# Patient Record
Sex: Male | Born: 1995 | Hispanic: Yes | Marital: Single
Health system: Southern US, Community
[De-identification: ages and names within clinical notes are randomized; demographics above are authoritative.]

---

## 2017-06-17 ENCOUNTER — Emergency Department (HOSPITAL_COMMUNITY): Payer: Self-pay

## 2017-06-17 ENCOUNTER — Emergency Department (HOSPITAL_COMMUNITY)
Admission: EM | Admit: 2017-06-17 | Discharge: 2017-06-17 | Disposition: A | Discharge status: Home / Self Care | Payer: Self-pay | Attending: Emergency Medicine | Admitting: Emergency Medicine

## 2017-06-17 DIAGNOSIS — Y939 Activity, unspecified: Secondary | ICD-10-CM | POA: Insufficient documentation

## 2017-06-17 DIAGNOSIS — W1830XA Fall on same level, unspecified, initial encounter: Secondary | ICD-10-CM | POA: Insufficient documentation

## 2017-06-17 DIAGNOSIS — Y999 Unspecified external cause status: Secondary | ICD-10-CM | POA: Insufficient documentation

## 2017-06-17 DIAGNOSIS — S0990XA Unspecified injury of head, initial encounter: Secondary | ICD-10-CM

## 2017-06-17 DIAGNOSIS — S098XXA Other specified injuries of head, initial encounter: Secondary | ICD-10-CM | POA: Insufficient documentation

## 2017-06-17 DIAGNOSIS — Y908 Blood alcohol level of 240 mg/100 ml or more: Secondary | ICD-10-CM | POA: Insufficient documentation

## 2017-06-17 DIAGNOSIS — F10920 Alcohol use, unspecified with intoxication, uncomplicated: Secondary | ICD-10-CM

## 2017-06-17 DIAGNOSIS — F10220 Alcohol dependence with intoxication, uncomplicated: Secondary | ICD-10-CM | POA: Insufficient documentation

## 2017-06-17 DIAGNOSIS — Y929 Unspecified place or not applicable: Secondary | ICD-10-CM | POA: Insufficient documentation

## 2017-06-17 LAB — COMPREHENSIVE METABOLIC PANEL
ALT: 93 U/L — ABNORMAL HIGH (ref 17–63)
ANION GAP: 12 (ref 5–15)
AST: 74 U/L — ABNORMAL HIGH (ref 15–41)
Albumin: 4.4 g/dL (ref 3.5–5.0)
Alkaline Phosphatase: 165 U/L — ABNORMAL HIGH (ref 38–126)
BUN: 10 mg/dL (ref 6–20)
CALCIUM: 8.3 mg/dL — AB (ref 8.9–10.3)
CHLORIDE: 107 mmol/L (ref 101–111)
CO2: 19 mmol/L — AB (ref 22–32)
Creatinine, Ser: 0.99 mg/dL (ref 0.61–1.24)
GFR calc non Af Amer: >60 mL/min (ref >60)
Glucose, Bld: 158 mg/dL — ABNORMAL HIGH (ref 65–99)
Potassium: 3.1 mmol/L — ABNORMAL LOW (ref 3.5–5.1)
SODIUM: 138 mmol/L (ref 135–145)
Total Bilirubin: 1.3 mg/dL — ABNORMAL HIGH (ref 0.3–1.2)
Total Protein: 6.9 g/dL (ref 6.5–8.1)

## 2017-06-17 LAB — ACETAMINOPHEN LEVEL

## 2017-06-17 LAB — CBC WITH DIFFERENTIAL/PLATELET
BASOS PCT: 0 %
Basophils Absolute: 0 10*3/uL (ref 0.0–0.1)
EOS ABS: 0.3 10*3/uL (ref 0.0–0.7)
EOS PCT: 2 %
HCT: 46.4 % (ref 39.0–52.0)
Hemoglobin: 16.8 g/dL (ref 13.0–17.0)
LYMPHS ABS: 4.2 10*3/uL — AB (ref 0.7–4.0)
Lymphocytes Relative: 33 %
MCH: 33.2 pg (ref 26.0–34.0)
MCHC: 36.2 g/dL — AB (ref 30.0–36.0)
MCV: 91.7 fL (ref 78.0–100.0)
Monocytes Absolute: 1.3 10*3/uL — ABNORMAL HIGH (ref 0.1–1.0)
Monocytes Relative: 10 %
Neutro Abs: 6.8 10*3/uL (ref 1.7–7.7)
Neutrophils Relative %: 55 %
PLATELETS: 305 10*3/uL (ref 150–400)
RBC: 5.06 MIL/uL (ref 4.22–5.81)
RDW: 12.5 % (ref 11.5–15.5)
WBC: 12.6 10*3/uL — AB (ref 4.0–10.5)

## 2017-06-17 LAB — SALICYLATE LEVEL

## 2017-06-17 LAB — ETHANOL: Alcohol, Ethyl (B): 334 mg/dL (ref <10)

## 2017-06-17 LAB — CK: Total CK: 96 U/L (ref 49–397)

## 2017-06-17 MED ORDER — ONDANSETRON HCL 4 MG/2ML IJ SOLN
INTRAMUSCULAR | Status: AC
  Filled 2017-06-17: qty 2

## 2017-06-17 MED ORDER — ONDANSETRON HCL 4 MG/2ML IJ SOLN
4.0000 mg | Freq: Once | INTRAMUSCULAR | Status: AC
  Administered 2017-06-17: 4 mg via INTRAVENOUS

## 2017-06-17 MED ORDER — SODIUM CHLORIDE 0.9 % IV SOLN
Freq: Once | INTRAVENOUS | Status: AC
  Administered 2017-06-17: 03:00:00 via INTRAVENOUS

## 2017-06-17 MED ORDER — SODIUM CHLORIDE 0.9 % IV SOLN
Freq: Once | INTRAVENOUS | Status: AC
  Administered 2017-06-17: 01:00:00 via INTRAVENOUS

## 2017-06-17 NOTE — Discharge Instructions (Signed)
Reduce your alcohol consumption. Follow up with your doctor. Return to the ED if you develop new or worsening symptoms.

## 2017-06-17 NOTE — ED Notes (Signed)
Pt tolerated water

## 2017-06-17 NOTE — ED Notes (Signed)
C-collar removed per Dr. Rancour.  

## 2017-06-17 NOTE — ED Provider Notes (Signed)
[facility]EMERGENCY DEPT Provider Note   CSN: [id number] Arrival date & time: 06/17/17  0025     History   Chief Complaint Chief Complaint  Patient presents with  . Alcohol Intoxication  . Fall    HPI Rick Frost is a 21 y.o. male.  Level V caveat for intoxication. Questionable whether patient speaks [place] as well. Patient arrives from home after passing out in his yard.EMS reports she was drinking tequila most of the day and fell and struck his head. He had several episodes of vomiting while on route. EMS concern for aspiration as he did vomit when he was lying flat. Patient is not answering questions but is alert and moving all extremities. He has an abrasion to his forehead and left cheek. There is no other obvious trauma. He is unable to give a history at this time.   The history is provided by the patient and the EMS personnel. The history is limited by the condition of the patient and a language barrier.  Alcohol Intoxication   Fall     No past medical history on file.  There are no active problems to display for this patient.   No past surgical history on file.     Home Medications    Prior to Admission medications   Not on File    Family History No family history on file.  Social History Social History  Substance Use Topics  . Smoking status: Not on file  . Smokeless tobacco: Not on file  . Alcohol use Not on file     Allergies   Patient has no allergy information on record.   Review of Systems Review of Systems  Unable to perform ROS: Mental status change     Physical Exam Updated Vital Signs BP (!) 110/48   Pulse 61   Temp (!) 97 F (36.1 C) (Oral)   Resp 16   SpO2 94%   Physical Exam  Constitutional: He appears well-developed and well-nourished. No distress.  HENT:  Head: Normocephalic and atraumatic.  Mouth/Throat: Oropharynx is clear and moist. No oropharyngeal exudate.  Abrasion to left forehead and left cheek Dentition  intact. No malocclusion or trismus. No septal hematoma or hemotympanum  Eyes: Pupils are equal, round, and reactive to light. Conjunctivae and EOM are normal.  Neck: Normal range of motion. Neck supple.  No C-spine tenderness  Cardiovascular: Normal rate, regular rhythm, normal heart sounds and intact distal pulses.   No murmur heard. Pulmonary/Chest: Effort normal and breath sounds normal. No respiratory distress. He exhibits no tenderness.  Abdominal: Soft. There is no tenderness. There is no rebound and no guarding.  Musculoskeletal: Normal range of motion. He exhibits no edema or tenderness.  No T or L-spine tenderness  Neurological: He is alert. No cranial nerve deficit. He exhibits normal muscle tone. Coordination normal.  No ataxia on finger to nose bilaterally. No pronator drift. 5/5 strength throughout. CN 2-12 intact.Equal grip strength. Sensation intact.   Skin: Skin is warm. Capillary refill takes less than 2 seconds.  Clothing is wet, covered in vomit  Psychiatric: He has a normal mood and affect. His behavior is normal.  Nursing note and vitals reviewed.    ED Treatments / Results  Labs (all labs ordered are listed, but only abnormal results are displayed) Labs Reviewed  CBC WITH DIFFERENTIAL/PLATELET - Abnormal; Notable for the following:       Result Value   WBC 12.6 (*)    MCHC 36.2 (*)  Lymphs Abs 4.2 (*)    Monocytes Absolute 1.3 (*)    All other components within normal limits  COMPREHENSIVE METABOLIC PANEL - Abnormal; Notable for the following:    Potassium 3.1 (*)    CO2 19 (*)    Glucose, Bld 158 (*)    Calcium 8.3 (*)    AST 74 (*)    ALT 93 (*)    Alkaline Phosphatase 165 (*)    Total Bilirubin 1.3 (*)    All other components within normal limits  ETHANOL - Abnormal; Notable for the following:    Alcohol, Ethyl (B) 334 (*)    All other components within normal limits  ACETAMINOPHEN LEVEL - Abnormal; Notable for the following:    Acetaminophen  (Tylenol), Serum <10 (*)    All other components within normal limits  SALICYLATE LEVEL  CK  RAPID URINE DRUG SCREEN, HOSP PERFORMED  URINALYSIS, ROUTINE W REFLEX MICROSCOPIC    EKG  EKG Interpretation  Date/Time:  Sunday June 17 2017 02:44:57 EDT Ventricular Rate:  70 PR Interval:    QRS Duration: 96 QT Interval:  398 QTC Calculation: 430 R Axis:   65 Text Interpretation:  Sinus rhythm No previous ECGs available Confirmed by Glynn Octave [phone]) on 06/17/2017 2:50:39 AM       Radiology Ct Head Wo Contrast  Result Date: 06/17/2017 CLINICAL DATA:  Initial evaluation for acute trauma, fall. EXAM: CT HEAD WITHOUT CONTRAST CT CERVICAL SPINE WITHOUT CONTRAST TECHNIQUE: Multidetector CT imaging of the head and cervical spine was performed following the standard protocol without intravenous contrast. Multiplanar CT image reconstructions of the cervical spine were also generated. COMPARISON:  None. FINDINGS: CT HEAD FINDINGS Brain: Cerebral volume within normal limits for patient age. No evidence for acute intracranial hemorrhage. No findings to suggest acute large vessel territory infarct. No mass lesion, midline shift, or mass effect. Ventricles are normal in size without evidence for hydrocephalus. No extra-axial fluid collection identified. Vascular: No hyperdense vessel identified. Skull: Scalp soft tissues demonstrate no acute abnormality.Calvarium intact. Sinuses/Orbits: Globes and orbital soft tissues are within normal limits. Left maxillary sinus is somewhat hypoplastic in appearance. Paranasal sinuses clear. No mastoid effusion. CT CERVICAL SPINE FINDINGS Alignment: Reversal of the normal cervical lordosis, apex at C3-4. No listhesis. Skull base and vertebrae: Skullbase intact. Normal C1-2 articulations preserved. Dens is intact. Vertebral body heights maintained. No acute fracture. Soft tissues and spinal canal: Soft tissues of the neck demonstrate no acute abnormality. No  prevertebral edema. Disc levels: No significant degenerative changes within cervical spine. Upper chest: Visualized upper chest within normal limits. Visualized lung apices are clear. Other: None. IMPRESSION: CT BRAIN: Negative head CT.  No acute intracranial abnormality identified. CT CERVICAL SPINE: 1. No acute traumatic injury within the cervical spine. 2. Straightening with mild reversal of the normal cervical lordosis, which may related to positioning and/or muscular spasm. Electronically Signed   By: Rise Mu M.D.   On: 06/17/2017 01:05   Ct Cervical Spine Wo Contrast  Result Date: 06/17/2017 CLINICAL DATA:  Initial evaluation for acute trauma, fall. EXAM: CT HEAD WITHOUT CONTRAST CT CERVICAL SPINE WITHOUT CONTRAST TECHNIQUE: Multidetector CT imaging of the head and cervical spine was performed following the standard protocol without intravenous contrast. Multiplanar CT image reconstructions of the cervical spine were also generated. COMPARISON:  None. FINDINGS: CT HEAD FINDINGS Brain: Cerebral volume within normal limits for patient age. No evidence for acute intracranial hemorrhage. No findings to suggest acute large vessel territory infarct. No mass  lesion, midline shift, or mass effect. Ventricles are normal in size without evidence for hydrocephalus. No extra-axial fluid collection identified. Vascular: No hyperdense vessel identified. Skull: Scalp soft tissues demonstrate no acute abnormality.Calvarium intact. Sinuses/Orbits: Globes and orbital soft tissues are within normal limits. Left maxillary sinus is somewhat hypoplastic in appearance. Paranasal sinuses clear. No mastoid effusion. CT CERVICAL SPINE FINDINGS Alignment: Reversal of the normal cervical lordosis, apex at C3-4. No listhesis. Skull base and vertebrae: Skullbase intact. Normal C1-2 articulations preserved. Dens is intact. Vertebral body heights maintained. No acute fracture. Soft tissues and spinal canal: Soft tissues of  the neck demonstrate no acute abnormality. No prevertebral edema. Disc levels: No significant degenerative changes within cervical spine. Upper chest: Visualized upper chest within normal limits. Visualized lung apices are clear. Other: None. IMPRESSION: CT BRAIN: Negative head CT.  No acute intracranial abnormality identified. CT CERVICAL SPINE: 1. No acute traumatic injury within the cervical spine. 2. Straightening with mild reversal of the normal cervical lordosis, which may related to positioning and/or muscular spasm. Electronically Signed   By: Rise Mu M.D.   On: 06/17/2017 01:05   Dg Chest Portable 1 View  Result Date: 06/17/2017 CLINICAL DATA:  Ground level fall tonight. EXAM: PORTABLE CHEST 1 VIEW COMPARISON:  None. FINDINGS: A single AP portable view of the chest demonstrates no focal airspace consolidation or alveolar edema. The lungs are grossly clear. There is no large effusion or pneumothorax. Cardiac and mediastinal contours appear unremarkable. IMPRESSION: No active disease. Electronically Signed   By: Ellery Plunk M.D.   On: 06/17/2017 01:07    Procedures Procedures (including critical care time)  Medications Ordered in ED Medications - No data to display   Initial Impression / Assessment and Plan / ED Course  I have reviewed the triage vital signs and the nursing notes.  Pertinent labs & imaging results that were available during my care of the patient were reviewed by me and considered in my medical decision making (see chart for details).    Intoxicated male with head injury and nausea and vomiting. GCS is 14. Patient is protecting her airway at this time we will withhold intubation.  CT head and C-spine are negative. Chest x-rays negative. No further episodes of vomiting. Patient is in no respiratory distress.  Patient given IV fluids and will be monitored to assess sobriety. Labs consistent with alcohol intoxication. Toxicology labs are negative.  Patient remains obtunded but is still protecting airway.  Recheck 6:15 AM. Patient is awake and alert and tolerating by mouth. He denies any pain. He is able to ambulate.  Results discussed through translator. Patient denies any suicidal thoughts. He states he feels well and wants to go home. There is a family member here to take him home. Decrease your alcohol intake. Return to the ED if you develop new or worsening symptoms.   Final Clinical Impressions(s) / ED Diagnoses   Final diagnoses:  Alcoholic intoxication without complication (HCC)  Injury of head, initial encounter    New Prescriptions New Prescriptions   No medications on file     Glynn Octave, MD 06/17/17 [phone]

## 2017-06-17 NOTE — ED Notes (Signed)
Pt arrived by EMS after drinking excessive amounts of ETOH then fell striking L side of face on the ground. Pt projectile vomited in the ambulance. Pt responsive to pain only, c-collar in place.

## 2017-06-17 NOTE — ED Notes (Signed)
Pt is Spanish speaking only, refusing to use urinal. Pt taken to bathroom via wheelchair. Able to use the restroom without difficulty, unable to provide sample at that time

## 2017-06-17 NOTE — ED Notes (Signed)
[place] [phone], pt's relative is going to go home but we can contact her as needed

## 2017-06-17 NOTE — ED Notes (Signed)
Pt ambulated in room with steady gait  

## 2018-07-22 IMAGING — CT CT HEAD W/O CM
5 of 8 series · 17 of 47 positions shown, 18 images · non-contrast
Comparison: None.

CLINICAL DATA: Initial evaluation for acute trauma, fall.

EXAM:
CT HEAD WITHOUT CONTRAST
CT CERVICAL SPINE WITHOUT CONTRAST
TECHNIQUE: Multidetector CT imaging of the head and cervical spine was
performed following the standard protocol without intravenous
contrast. Multiplanar CT image reconstructions of the cervical spine
were also generated.

[Series 4: head without · axial · non-contrast · 0.41mm/px · z∈[-169,-4]mm · 3 of 34 slices shown, 4 images]
[im 1/34  brain]
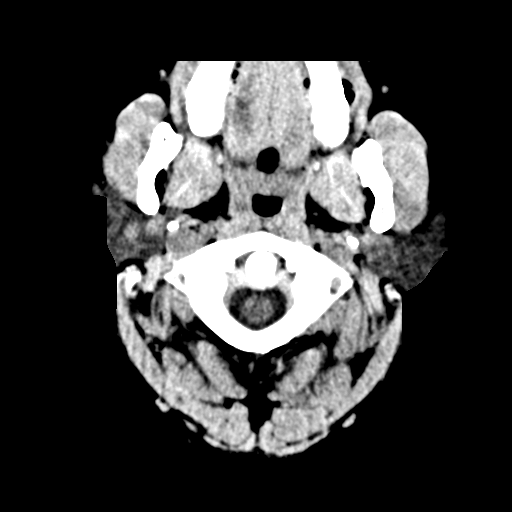
[im 1/34  bone]
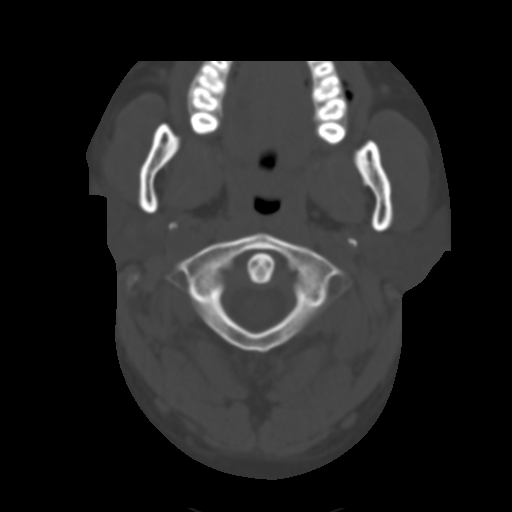
[im 17/34  brain]
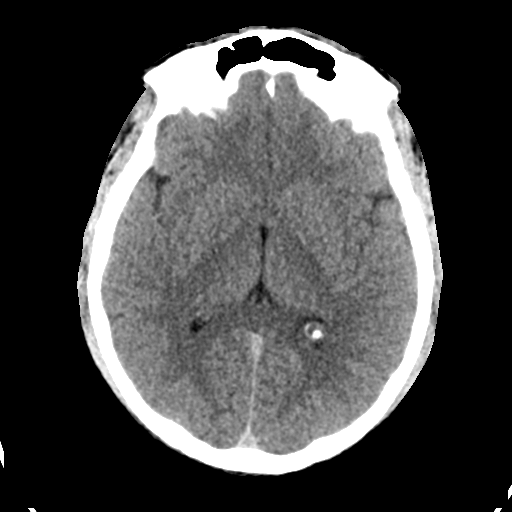
[im 34/34  brain]
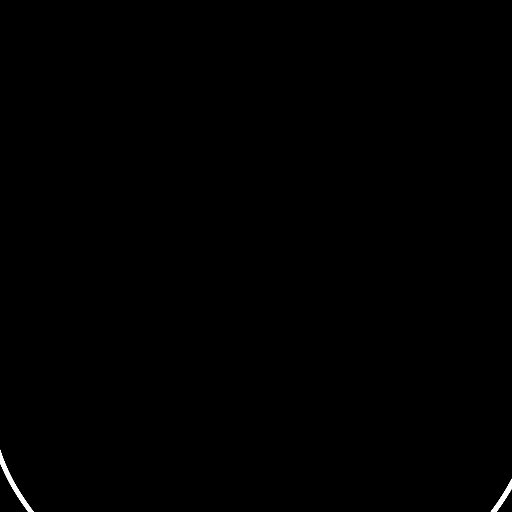

[Series 5: head bone · axial · 0.41mm/px · z∈[-149,-23]mm · 7 of 85 slices shown]
[im 11/85  bone]
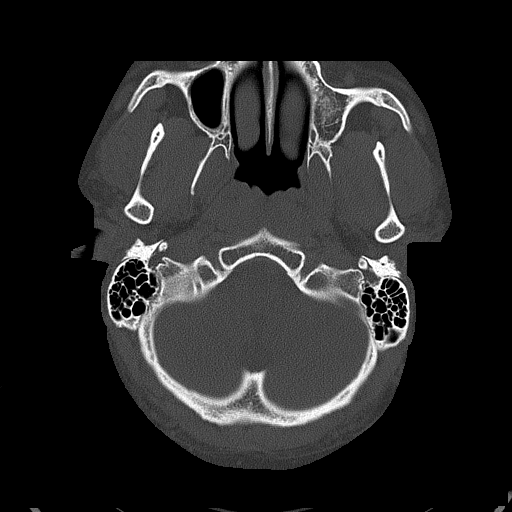
[im 22/85  bone]
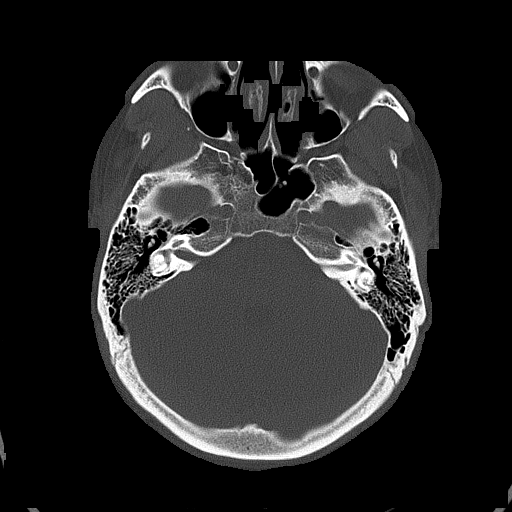
[im 32/85  bone]
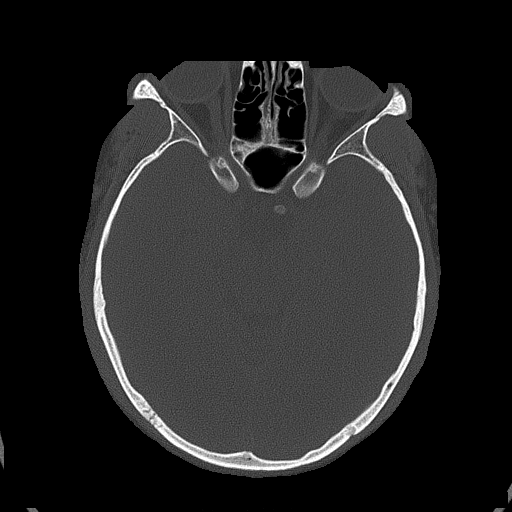
[im 43/85  bone]
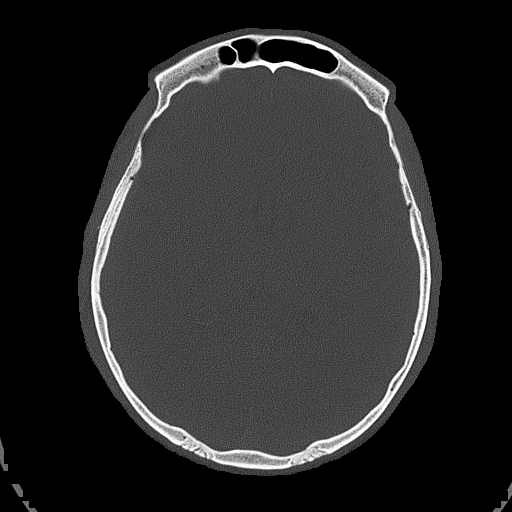
[im 53/85  bone]
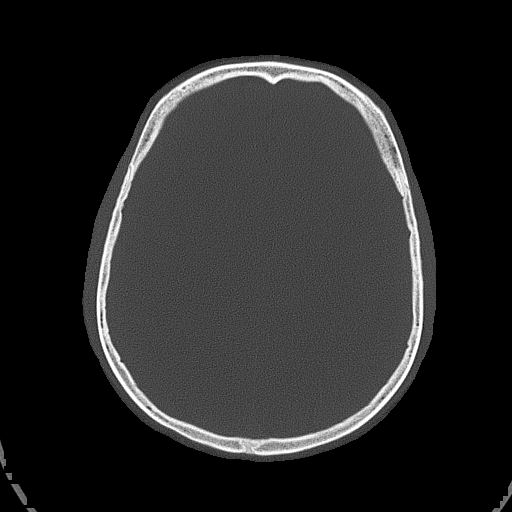
[im 64/85  bone]
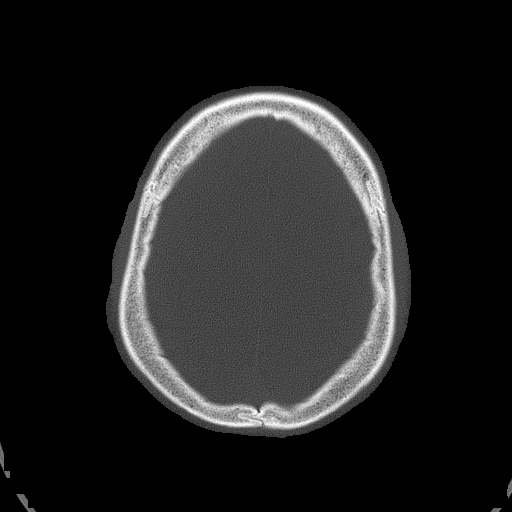
[im 74/85  bone]
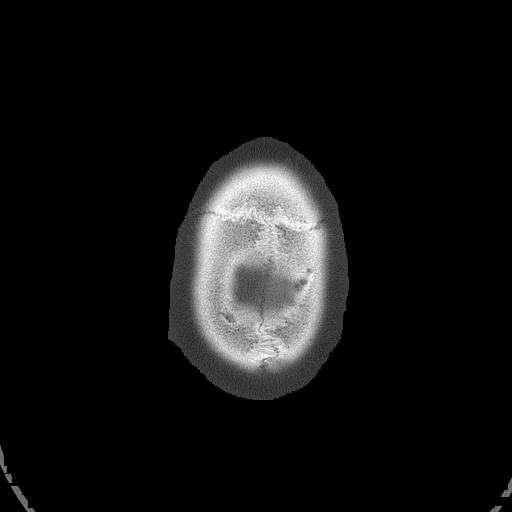

[Series 6: head without cor · coronal · non-contrast · 0.33mm/px · 3 of 68 slices shown]
[im 17/68  brain]
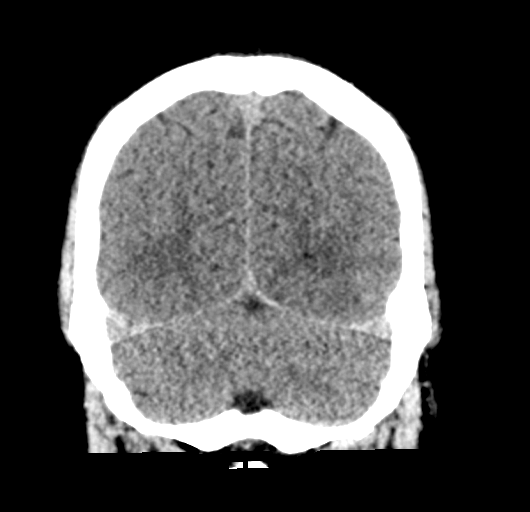
[im 34/68  brain]
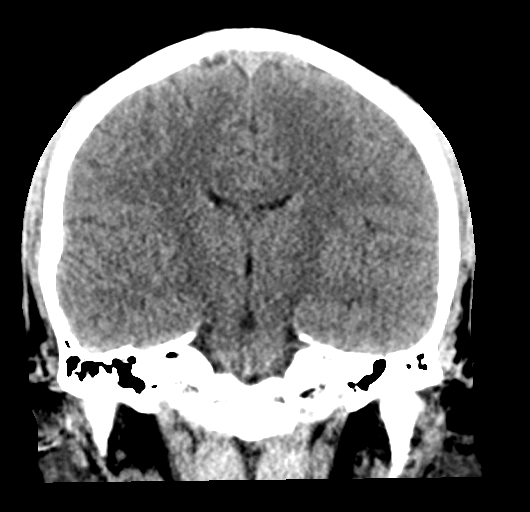
[im 51/68  brain]
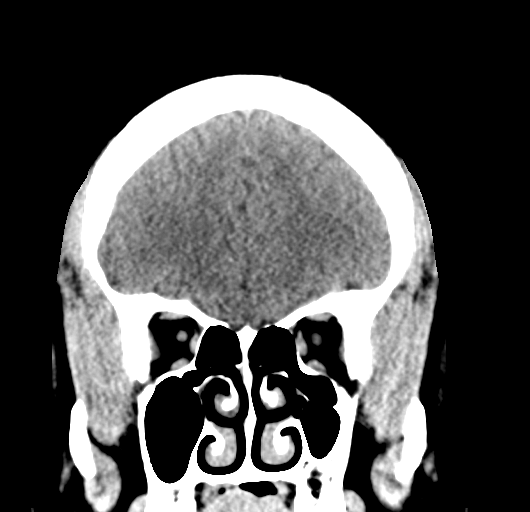

[Series 7: head without sag · sagittal · non-contrast · 0.33mm/px · 1 of 61 slices shown]
[im 31/61  brain]
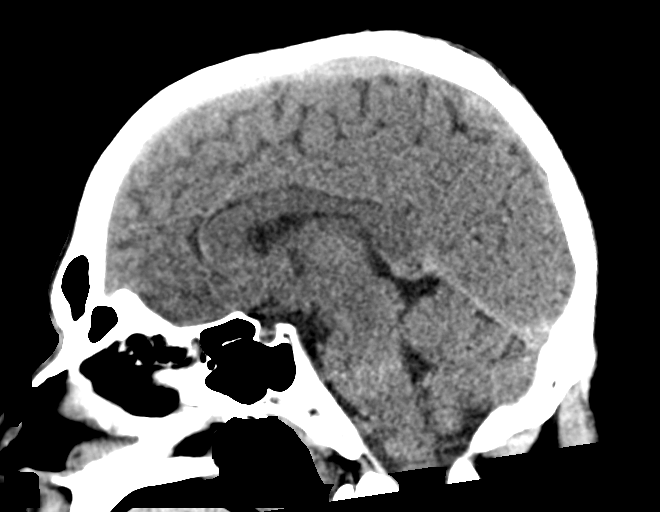

[Series 8: c_spine 2.0 st · axial · 0.28mm/px · z∈[-298,-254]mm · 3 of 88 slices shown]
[im 11/88  brain]
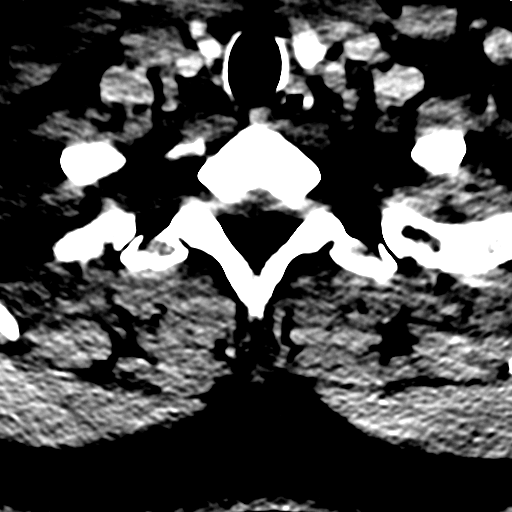
[im 22/88  brain]
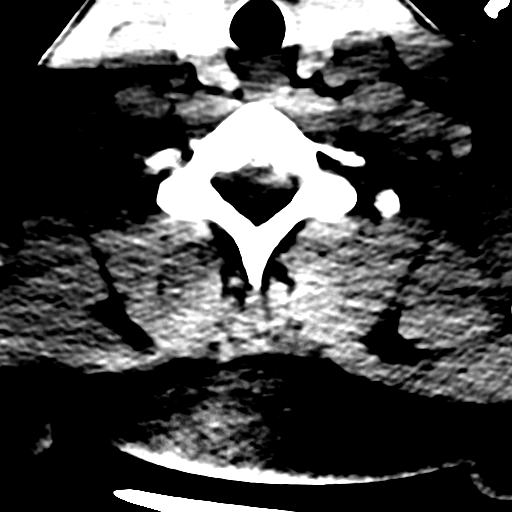
[im 33/88  brain]
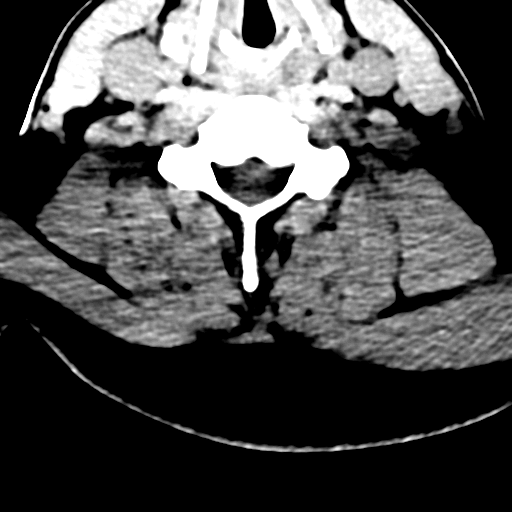

[17 of 47 positions shown; findings below may reference images not displayed]

FINDINGS: CT HEAD FINDINGS

Brain: Cerebral volume within normal limits for patient age.

No evidence for acute intracranial hemorrhage. No findings to
suggest acute large vessel territory infarct. No mass lesion,
midline shift, or mass effect. Ventricles are normal in size without
evidence for hydrocephalus. No extra-axial fluid collection
identified.

Vascular: No hyperdense vessel identified.

Skull: Scalp soft tissues demonstrate no acute abnormality.Calvarium
intact.

Sinuses/Orbits: Globes and orbital soft tissues are within normal
limits.

Left maxillary sinus is somewhat hypoplastic in appearance.
Paranasal sinuses clear. No mastoid effusion.

CT CERVICAL SPINE FINDINGS

Alignment: Reversal of the normal cervical lordosis, apex at C3-4.
No listhesis.

Skull base and vertebrae: Skullbase intact. Normal C1-2
articulations preserved. Dens is intact. Vertebral body heights
maintained. No acute fracture.

Soft tissues and spinal canal: Soft tissues of the neck demonstrate
no acute abnormality. No prevertebral edema.

Disc levels: No significant degenerative changes within cervical
spine.

Upper chest: Visualized upper chest within normal limits. Visualized
lung apices are clear.

Other: None.
IMPRESSION: CT BRAIN:

Negative head CT.  No acute intracranial abnormality identified.

CT CERVICAL SPINE:

1. No acute traumatic injury within the cervical spine.
2. Straightening with mild reversal of the normal cervical lordosis,
which may related to positioning and/or muscular spasm.
